# Patient Record
Sex: Female | Born: 1985 | Race: White | Hispanic: No | Marital: Single | Smoking: Never smoker
Health system: Southern US, Community
[De-identification: ages and names within clinical notes are randomized; demographics above are authoritative.]

## PROBLEM LIST (undated history)

## (undated) DIAGNOSIS — R011 Cardiac murmur, unspecified: Secondary | ICD-10-CM

## (undated) DIAGNOSIS — K219 Gastro-esophageal reflux disease without esophagitis: Secondary | ICD-10-CM

## (undated) DIAGNOSIS — F32A Depression, unspecified: Secondary | ICD-10-CM

## (undated) DIAGNOSIS — Z8619 Personal history of other infectious and parasitic diseases: Secondary | ICD-10-CM

## (undated) DIAGNOSIS — K759 Inflammatory liver disease, unspecified: Secondary | ICD-10-CM

## (undated) HISTORY — DX: Depression, unspecified: F32.A

## (undated) HISTORY — DX: Inflammatory liver disease, unspecified: K75.9

## (undated) HISTORY — DX: Personal history of other infectious and parasitic diseases: Z86.19

## (undated) HISTORY — DX: Gastro-esophageal reflux disease without esophagitis: K21.9

## (undated) HISTORY — DX: Cardiac murmur, unspecified: R01.1

---

## 2020-05-29 ENCOUNTER — Emergency Department (HOSPITAL_COMMUNITY)
Admission: EM | Admit: 2020-05-29 | Discharge: 2020-05-29 | Disposition: A | Discharge status: Home / Self Care | Payer: Self-pay | Attending: Emergency Medicine | Admitting: Emergency Medicine

## 2020-05-29 ENCOUNTER — Emergency Department (HOSPITAL_COMMUNITY): Payer: Self-pay

## 2020-05-29 ENCOUNTER — Other Ambulatory Visit: Payer: Self-pay

## 2020-05-29 DIAGNOSIS — J705 Respiratory conditions due to smoke inhalation: Secondary | ICD-10-CM | POA: Insufficient documentation

## 2020-05-29 DIAGNOSIS — T59811A Toxic effect of smoke, accidental (unintentional), initial encounter: Secondary | ICD-10-CM | POA: Insufficient documentation
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
## 2020-05-29 NOTE — ED Notes (Signed)
Patient tolerated ambulation. spo2 100 and hr112
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
## 2022-12-11 ENCOUNTER — Encounter: Payer: Self-pay | Admitting: Family Medicine

## 2022-12-11 ENCOUNTER — Ambulatory Visit (INDEPENDENT_AMBULATORY_CARE_PROVIDER_SITE_OTHER): Payer: Medicaid Other | Admitting: Family Medicine

## 2022-12-11 VITALS — BP 116/80 | HR 90 | Temp 98.0°F | Ht 64.0 in | Wt 208.2 lb

## 2022-12-11 DIAGNOSIS — K219 Gastro-esophageal reflux disease without esophagitis: Secondary | ICD-10-CM

## 2022-12-11 DIAGNOSIS — R051 Acute cough: Secondary | ICD-10-CM | POA: Diagnosis not present

## 2022-12-11 DIAGNOSIS — J0100 Acute maxillary sinusitis, unspecified: Secondary | ICD-10-CM | POA: Diagnosis not present

## 2022-12-11 DIAGNOSIS — R6250 Unspecified lack of expected normal physiological development in childhood: Secondary | ICD-10-CM | POA: Diagnosis not present

## 2022-12-11 DIAGNOSIS — J3089 Other allergic rhinitis: Secondary | ICD-10-CM

## 2022-12-11 DIAGNOSIS — F840 Autistic disorder: Secondary | ICD-10-CM

## 2022-12-11 DIAGNOSIS — R059 Cough, unspecified: Secondary | ICD-10-CM | POA: Insufficient documentation

## 2022-12-11 DIAGNOSIS — J309 Allergic rhinitis, unspecified: Secondary | ICD-10-CM | POA: Insufficient documentation

## 2022-12-11 DIAGNOSIS — R2681 Unsteadiness on feet: Secondary | ICD-10-CM

## 2022-12-11 MED ORDER — AMOXICILLIN-POT CLAVULANATE 875-125 MG PO TABS: 1.0000 | ORAL_TABLET | Freq: Two times a day (BID) | ORAL | 0 refills | Status: AC

## 2022-12-11 NOTE — Patient Instructions (Signed)
Saline spray- spray up the nose several times a day to help rinse the sinuses.
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
## 2022-12-13 ENCOUNTER — Encounter: Payer: Self-pay | Admitting: Family Medicine

## 2024-01-16 ENCOUNTER — Other Ambulatory Visit: Payer: Self-pay | Admitting: Medical Genetics

## 2024-04-06 ENCOUNTER — Other Ambulatory Visit: Payer: Self-pay | Admitting: Medical Genetics

## 2024-04-06 DIAGNOSIS — Z006 Encounter for examination for normal comparison and control in clinical research program: Secondary | ICD-10-CM

## 2024-04-09 ENCOUNTER — Telehealth: Payer: Self-pay | Admitting: Internal Medicine

## 2024-05-31 LAB — GENECONNECT MOLECULAR SCREEN: Genetic Analysis Overall Interpretation: NEGATIVE

## 2024-12-15 ENCOUNTER — Ambulatory Visit: Admitting: Physician Assistant
